# Patient Record
Sex: Female | Born: 1960 | Race: White | Hispanic: No | Marital: Married | Smoking: Never smoker
Health system: Southern US, Community
[De-identification: ages and names within clinical notes are randomized; demographics above are authoritative.]

## PROBLEM LIST (undated history)

## (undated) DIAGNOSIS — E119 Type 2 diabetes mellitus without complications: Secondary | ICD-10-CM

## (undated) HISTORY — PX: TONSILECTOMY, ADENOIDECTOMY, BILATERAL MYRINGOTOMY AND TUBES: SHX2538

## (undated) HISTORY — DX: Type 2 diabetes mellitus without complications: E11.9

---

## 2000-05-04 ENCOUNTER — Encounter: Payer: Self-pay | Admitting: Unknown Physician Specialty

## 2000-05-04 ENCOUNTER — Encounter: Admission: RE | Admit: 2000-05-04 | Discharge: 2000-05-04 | Payer: Self-pay | Admitting: Unknown Physician Specialty

## 2001-05-19 ENCOUNTER — Other Ambulatory Visit: Admission: RE | Admit: 2001-05-19 | Discharge: 2001-05-19 | Payer: Self-pay | Admitting: Obstetrics and Gynecology

## 2002-08-09 ENCOUNTER — Other Ambulatory Visit: Admission: RE | Admit: 2002-08-09 | Discharge: 2002-08-09 | Payer: Self-pay | Admitting: Obstetrics and Gynecology

## 2004-01-16 ENCOUNTER — Other Ambulatory Visit: Admission: RE | Admit: 2004-01-16 | Discharge: 2004-01-16 | Payer: Self-pay | Admitting: Obstetrics and Gynecology

## 2004-04-30 ENCOUNTER — Ambulatory Visit: Payer: Self-pay | Admitting: Cardiology

## 2004-05-14 ENCOUNTER — Encounter: Admission: RE | Admit: 2004-05-14 | Discharge: 2004-06-12 | Payer: Self-pay | Admitting: Cardiology

## 2014-05-01 ENCOUNTER — Other Ambulatory Visit: Payer: Self-pay | Admitting: Endocrinology

## 2014-05-01 DIAGNOSIS — E041 Nontoxic single thyroid nodule: Secondary | ICD-10-CM

## 2014-05-03 ENCOUNTER — Ambulatory Visit
Admission: RE | Admit: 2014-05-03 | Discharge: 2014-05-03 | Disposition: A | Discharge status: Home / Self Care | Payer: BC Managed Care – PPO | Source: Ambulatory Visit | Attending: Endocrinology | Admitting: Endocrinology

## 2014-05-03 DIAGNOSIS — E041 Nontoxic single thyroid nodule: Secondary | ICD-10-CM

## 2014-05-04 ENCOUNTER — Other Ambulatory Visit: Payer: Self-pay | Admitting: Endocrinology

## 2014-05-04 DIAGNOSIS — E042 Nontoxic multinodular goiter: Secondary | ICD-10-CM

## 2014-05-07 ENCOUNTER — Other Ambulatory Visit: Payer: Self-pay | Admitting: Endocrinology

## 2014-05-07 DIAGNOSIS — E042 Nontoxic multinodular goiter: Secondary | ICD-10-CM

## 2014-05-08 ENCOUNTER — Other Ambulatory Visit (HOSPITAL_COMMUNITY)
Admission: RE | Admit: 2014-05-08 | Discharge: 2014-05-08 | Disposition: A | Discharge status: Home / Self Care | Payer: BC Managed Care – PPO | Source: Ambulatory Visit | Attending: Interventional Radiology | Admitting: Interventional Radiology

## 2014-05-08 ENCOUNTER — Ambulatory Visit
Admission: RE | Admit: 2014-05-08 | Discharge: 2014-05-08 | Disposition: A | Discharge status: Home / Self Care | Payer: BC Managed Care – PPO | Source: Ambulatory Visit | Attending: Endocrinology | Admitting: Endocrinology

## 2014-05-08 DIAGNOSIS — E042 Nontoxic multinodular goiter: Secondary | ICD-10-CM

## 2014-05-08 DIAGNOSIS — E041 Nontoxic single thyroid nodule: Secondary | ICD-10-CM | POA: Diagnosis present

## 2014-05-10 ENCOUNTER — Other Ambulatory Visit: Payer: BC Managed Care – PPO

## 2014-05-22 ENCOUNTER — Ambulatory Visit: Payer: BC Managed Care – PPO

## 2014-05-31 ENCOUNTER — Ambulatory Visit (INDEPENDENT_AMBULATORY_CARE_PROVIDER_SITE_OTHER): Payer: BC Managed Care – PPO | Admitting: Podiatry

## 2014-05-31 ENCOUNTER — Encounter: Payer: Self-pay | Admitting: Podiatry

## 2014-05-31 ENCOUNTER — Ambulatory Visit (INDEPENDENT_AMBULATORY_CARE_PROVIDER_SITE_OTHER): Payer: BC Managed Care – PPO

## 2014-05-31 VITALS — BP 143/93 | HR 67 | Resp 16 | Ht 66.0 in | Wt 200.0 lb

## 2014-05-31 DIAGNOSIS — M2041 Other hammer toe(s) (acquired), right foot: Secondary | ICD-10-CM

## 2014-05-31 DIAGNOSIS — E119 Type 2 diabetes mellitus without complications: Secondary | ICD-10-CM

## 2014-05-31 DIAGNOSIS — D649 Anemia, unspecified: Secondary | ICD-10-CM | POA: Insufficient documentation

## 2014-05-31 DIAGNOSIS — E7800 Pure hypercholesterolemia, unspecified: Secondary | ICD-10-CM | POA: Insufficient documentation

## 2014-05-31 NOTE — Progress Notes (Signed)
   Subjective:    Patient ID: Laura Macdonald, female    DOB: 06/14/1961, 53 y.o.   MRN: [id number]  HPI Comments: Toes are separating and becoming scrunched up , the last 6-9 months the big toe has become numb. Saw another doctor and suggested surgery , i wanted to get another opinion      Review of Systems  All other systems reviewed and are negative.      Objective:   Physical Exam: I have reviewed her past mental history medications allergies surgery social history and review of systems. Pulses are strongly palpable bilateral. Neurologic sensorium is intact per Semmes-Weinstein monofilament. Deep tendon reflexes are intact bilateral and muscle strength +5 over 5 dorsiflexion plantar flexors and inverters and evertors onto the musculature is intact. Orthopedic evaluation demonstrates hallux abductovalgus deformities bilateral with hammertoe deformities 2 through 5 bilateral as well as lateral deviation and dislocation of the second and third metatarsophalangeal joints of the right foot. These are painful on palpation and range of motion. Radiographic evaluation does demonstrate severe metatarsus adductus with hammertoe deformities and dislocation of the second and third metatarsophalangeal joints of the right foot. Cutaneous evaluation demonstrates supple well-hydrated cutis no erythema edema saline drainage or odor no lesions noted.        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  Plan: Discussed etiology pathology conservative versus surgical therapies at this point I did suggest the surgical procedure would be necessary however the success rate for lateral deviation being translocated medially is not as good as the [place]. She will continue to follow-up with her doctor in [place].

## 2015-02-21 ENCOUNTER — Other Ambulatory Visit: Payer: Self-pay | Admitting: Obstetrics and Gynecology

## 2015-02-22 LAB — CYTOLOGY - PAP

## 2015-02-28 ENCOUNTER — Other Ambulatory Visit: Payer: Self-pay | Admitting: Obstetrics and Gynecology

## 2015-02-28 DIAGNOSIS — R928 Other abnormal and inconclusive findings on diagnostic imaging of breast: Secondary | ICD-10-CM

## 2015-03-07 ENCOUNTER — Other Ambulatory Visit: Payer: Self-pay

## 2015-03-20 ENCOUNTER — Ambulatory Visit
Admission: RE | Admit: 2015-03-20 | Discharge: 2015-03-20 | Disposition: A | Discharge status: Home / Self Care | Payer: BLUE CROSS/BLUE SHIELD | Source: Ambulatory Visit | Attending: Obstetrics and Gynecology | Admitting: Obstetrics and Gynecology

## 2015-03-20 DIAGNOSIS — R928 Other abnormal and inconclusive findings on diagnostic imaging of breast: Secondary | ICD-10-CM

## 2015-05-07 ENCOUNTER — Other Ambulatory Visit: Payer: Self-pay | Admitting: Endocrinology

## 2015-05-07 DIAGNOSIS — E041 Nontoxic single thyroid nodule: Secondary | ICD-10-CM

## 2015-06-04 ENCOUNTER — Other Ambulatory Visit: Payer: BLUE CROSS/BLUE SHIELD

## 2015-06-05 ENCOUNTER — Ambulatory Visit
Admission: RE | Admit: 2015-06-05 | Discharge: 2015-06-05 | Disposition: A | Discharge status: Home / Self Care | Payer: BLUE CROSS/BLUE SHIELD | Source: Ambulatory Visit | Attending: Endocrinology | Admitting: Endocrinology

## 2015-06-05 DIAGNOSIS — E041 Nontoxic single thyroid nodule: Secondary | ICD-10-CM

## 2016-05-08 IMAGING — US US THYROID BIOPSY
1 series · 13 of 16 positions shown · non-contrast
Comparison: none

CLINICAL DATA: 53-year-old female referred for biopsy of 2 separate
thyroid lesions.

[Series 1: us thyroid biopsy · 0.08mm/px · 16 acquisitions, 13 frames shown]
[im 1/16]
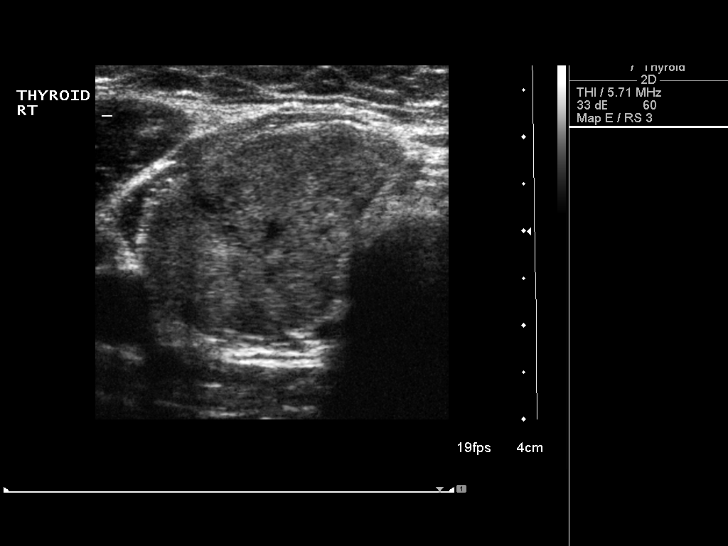
[im 2/16]
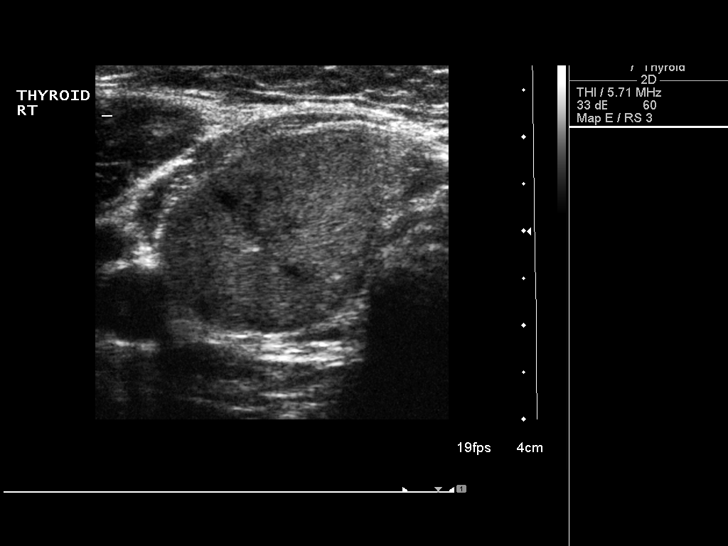
[im 4/16]
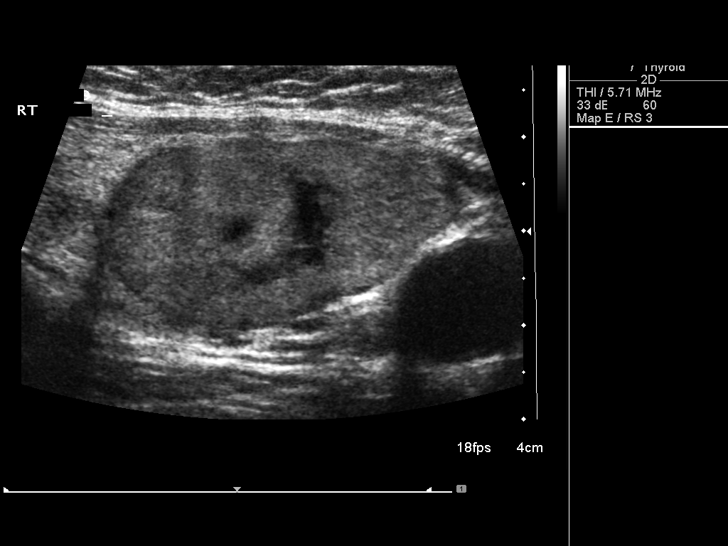
[im 5/16]
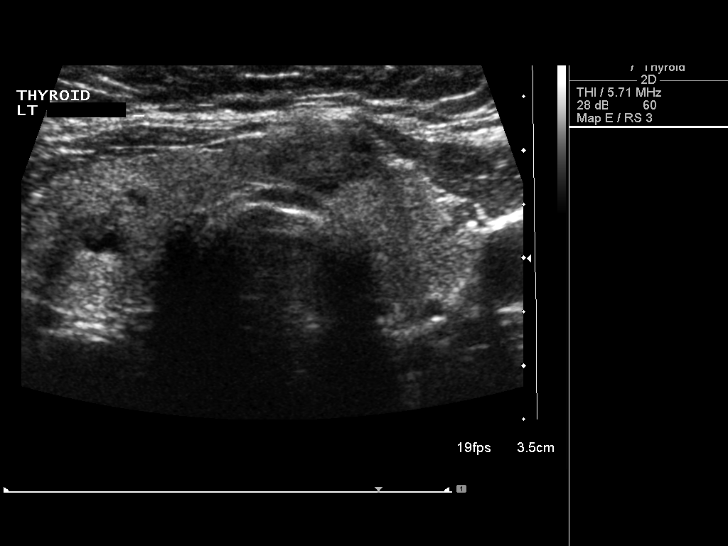
[im 6/16]
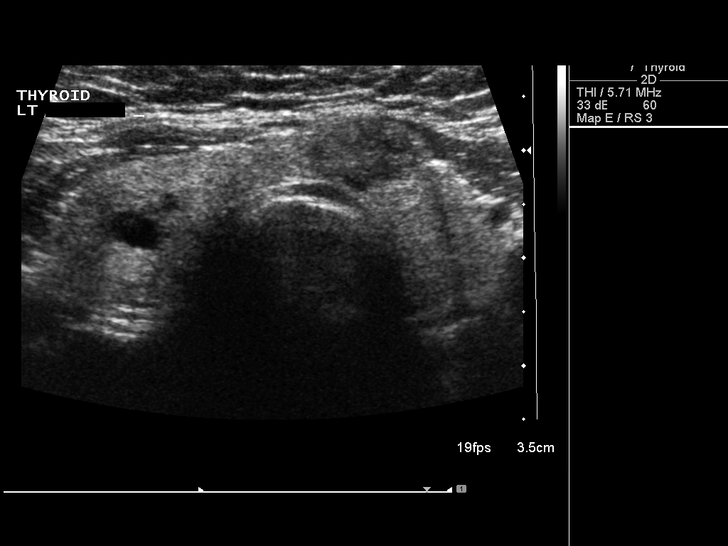
[im 7/16]
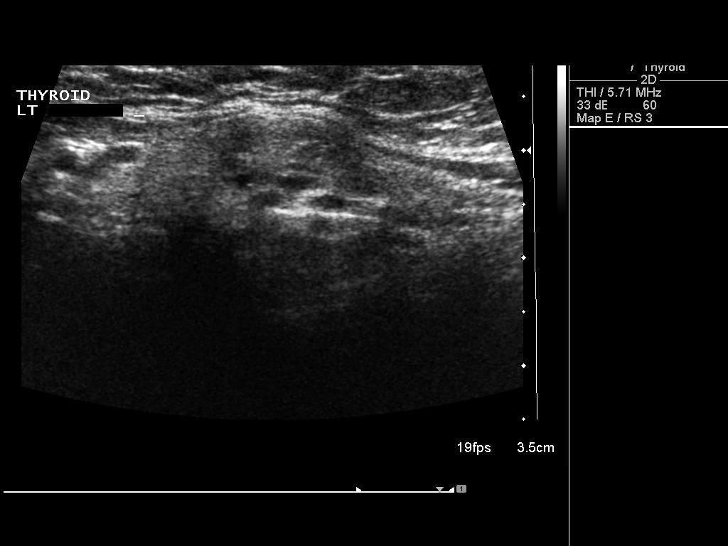
[im 9/16]
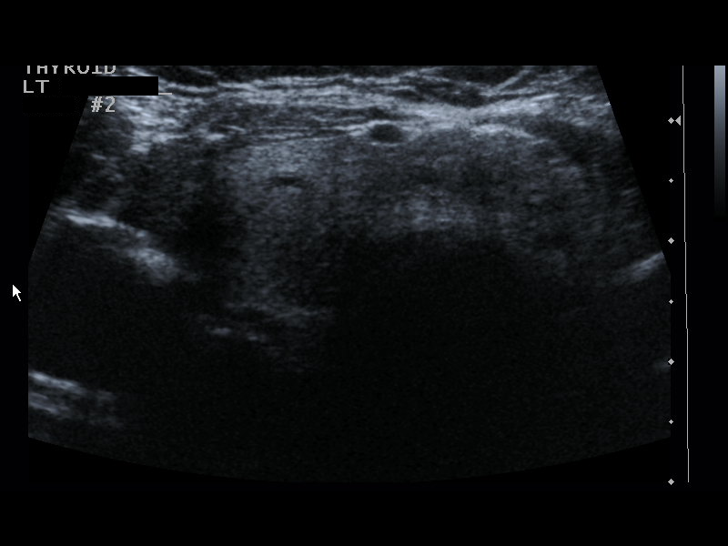
[im 10/16]
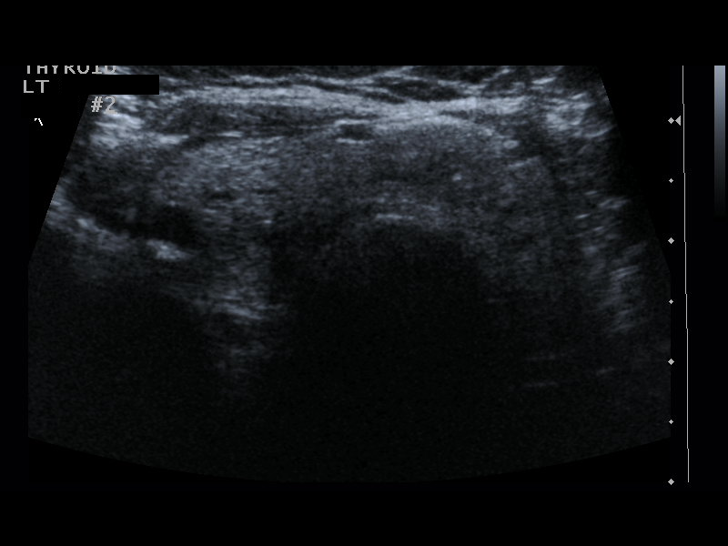
[im 11/16]
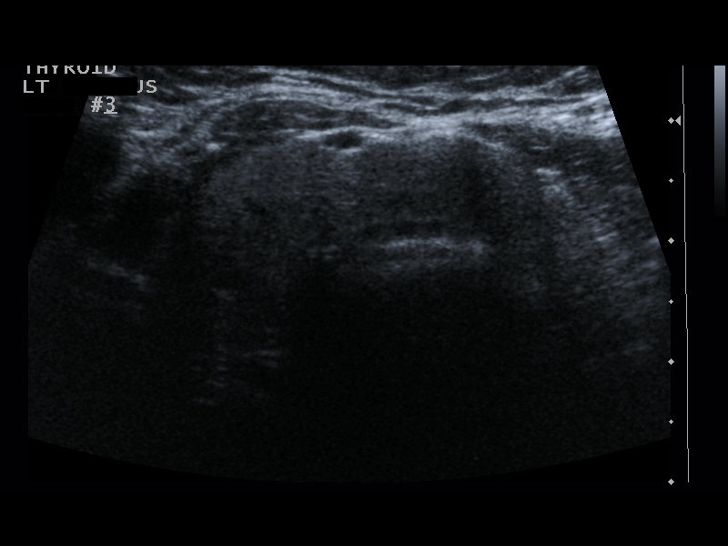
[im 12/16]
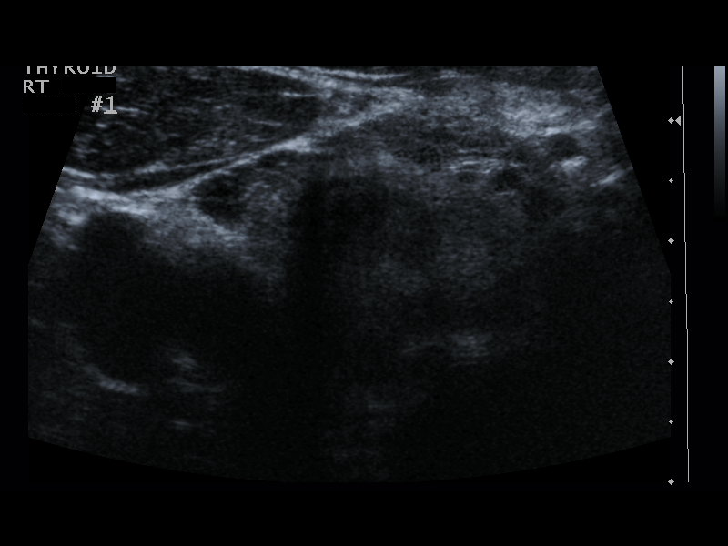
[im 13/16]
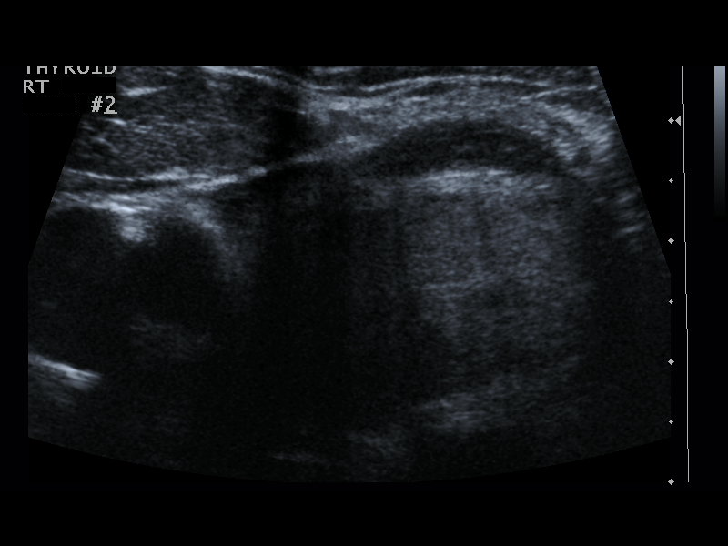
[im 15/16]
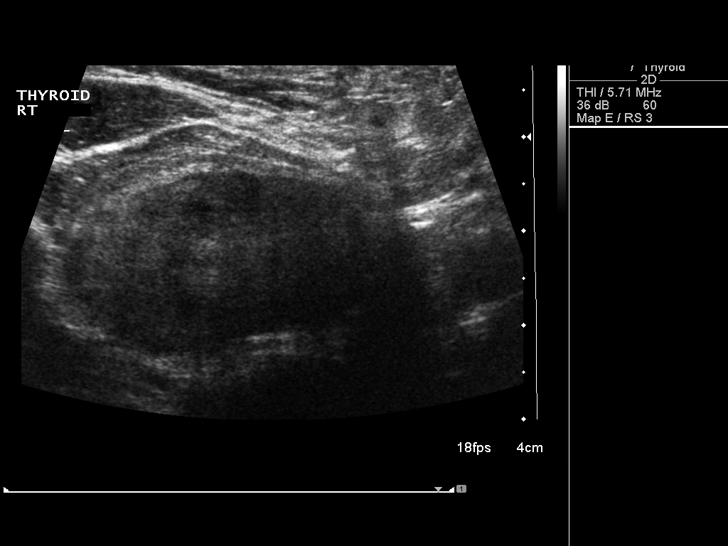
[im 16/16]
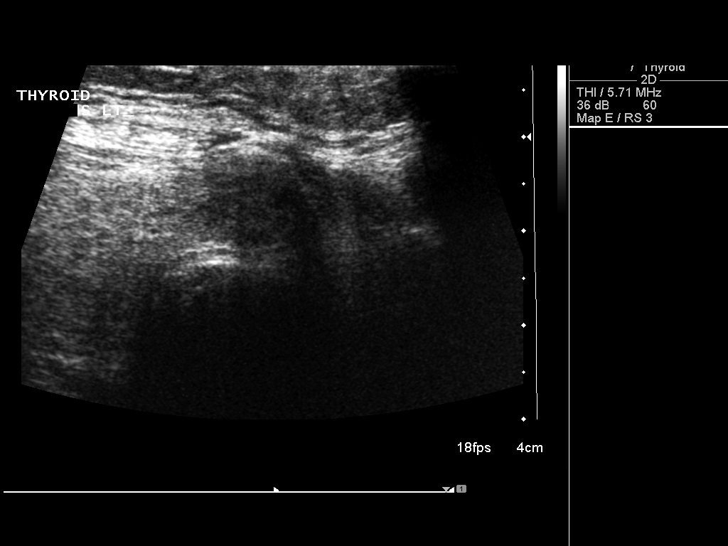

[13 of 16 positions shown; findings below may reference images not displayed]

EXAM:
ULTRASOUND GUIDED FINE NEEDLE ASPIRATION/BIOPSY BIOPSY OF THYROID
NODULES

MEDICATIONS:
NO MEDICATIONS

Total Moderate Sedation Time: NONE

PROCEDURE:
The procedure, risks, benefits, and alternatives were explained to
the patient. Questions regarding the procedure were encouraged and
answered. The patient understands and consents to the procedure.

Ultrasound survey was performed with images stored and sent to PACs.

The neck was prepped with Betadine in a sterile fashion, and a
sterile drape was applied covering the operative field. A sterile
gown and sterile gloves were used for the procedure. Local
anesthesia was provided with 1% Lidocaine.

Ultrasound guidance was used to infiltrate the region with 1%
lidocaine for local anesthesia. Three separate 25 gauge fine needle
biopsy were then acquired of the isthmic nodule using ultrasound
guidance. Images were stored.

Subsequently, ultrasound guidance was used to infiltrate the skin
overlying it a right thyroid nodule. Three separate 25 gauge fine
needle biopsy were acquired of this right nodule using ultrasound
guidance. Images were stored.

Slide preparation was performed.

Final image was stored after biopsy.

Patient tolerated the procedure well and remained hemodynamically
stable throughout.

No complications were encountered and no significant blood loss was
encounter

COMPLICATIONS:
None.
FINDINGS: Ultrasound images demonstrate isthmic nodule and right thyroid
nodule. Three separate 25 gauge fine needle biopsy were achieved of
both the isthmic nodule and a right thyroid nodule.

The post images demonstrate no fluid at the biopsy sites.

The patient had a vasovagal reaction after the procedure. She was
monitored for 1 hr with normalization of heart rate and blood
pressure. Upon discharge systolic blood pressure measured 120 mm Hg.
Heart rate was 60-70. She will was responsive and feeling normal
upon her discharge with her husband.
IMPRESSION: Status post biopsy of isthmic nodule and right thyroid nodule.
Tissue specimen sent to pathology for complete histopathologic
analysis.

## 2017-03-20 IMAGING — MG MM DIAG BREAST TOMO UNI LEFT
8 series · 8 of 24 positions shown · non-contrast
Comparison: Previous exam(s).

CLINICAL DATA: 54-year-old female presenting as screening recall
for a possible mass in the subareolar left breast.

EXAM:
DIGITAL DIAGNOSTIC LEFT MAMMOGRAM WITH 3D TOMOSYNTHESIS AND CAD
LEFT BREAST ULTRASOUND

[L CC (1 of 2)]
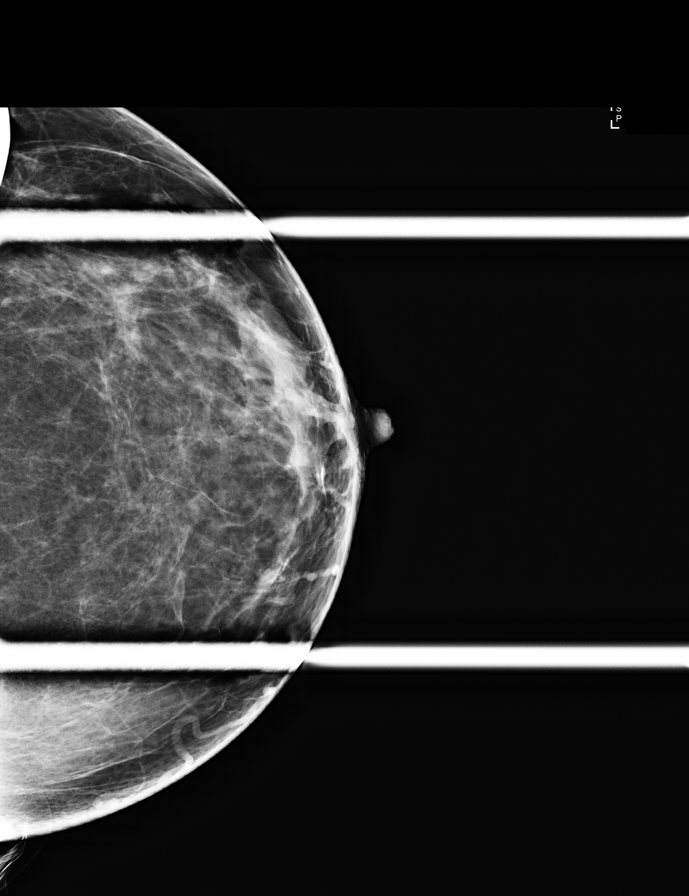

[L MLO (1 of 2)]
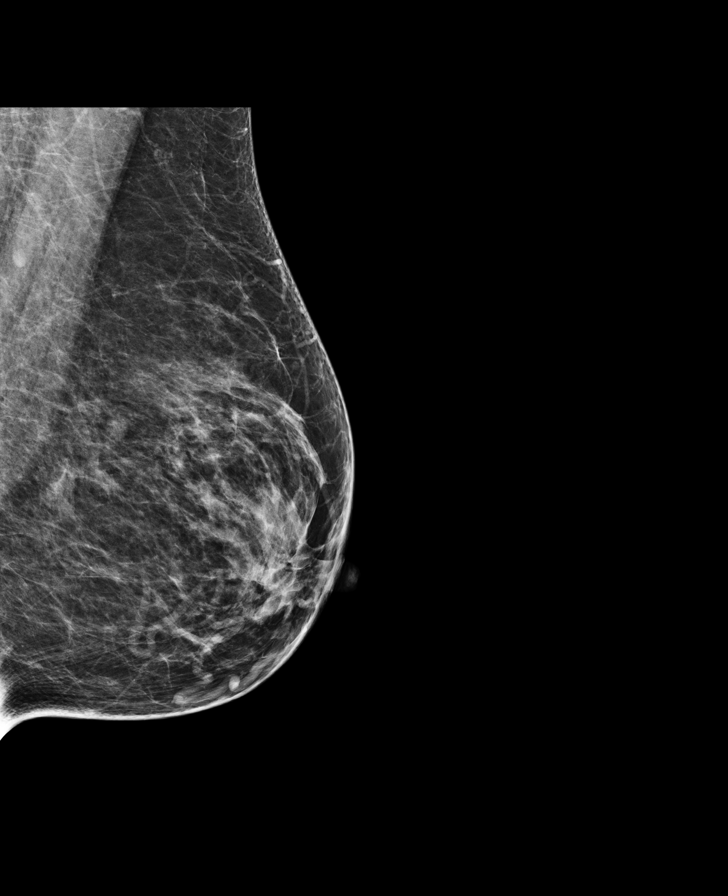

[L MLO (2 of 2)]
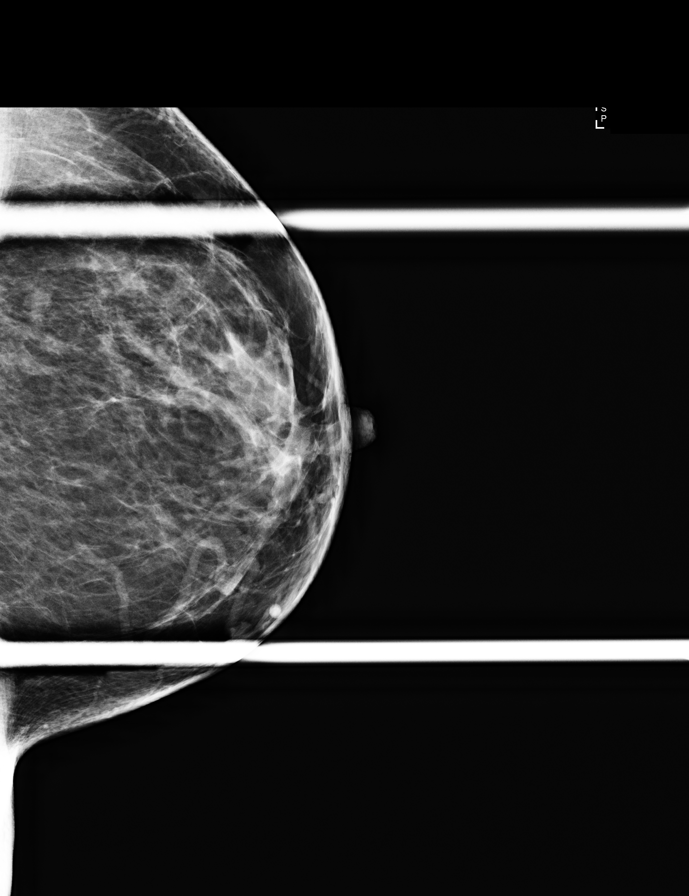

[L CC (2 of 2)]
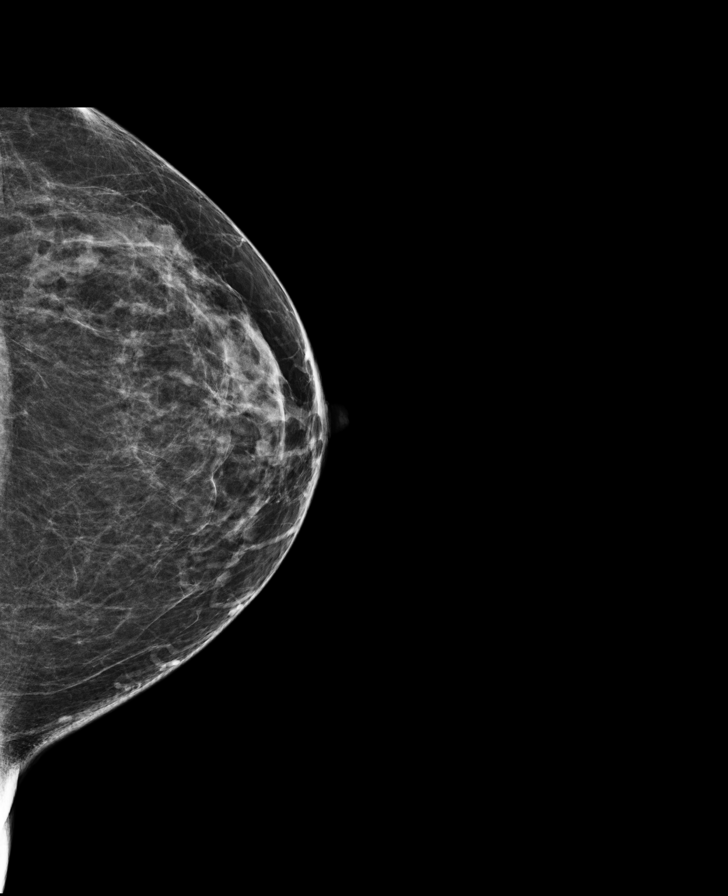

[L CC tomo (1 of 2) · tomo slice 32/63.0]
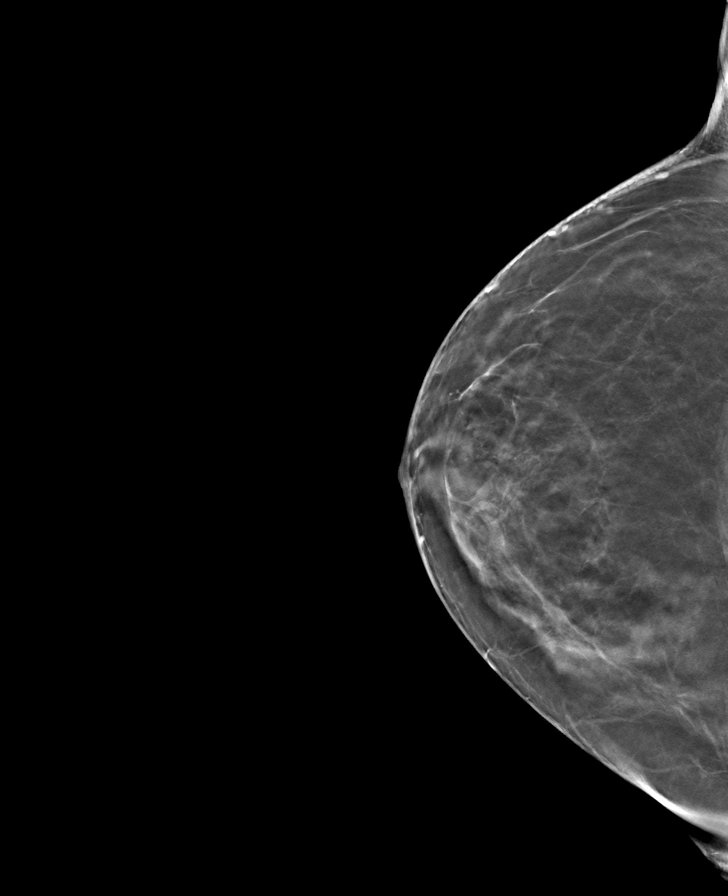

[L MLO tomo (1 of 2) · tomo slice 30/59.0]
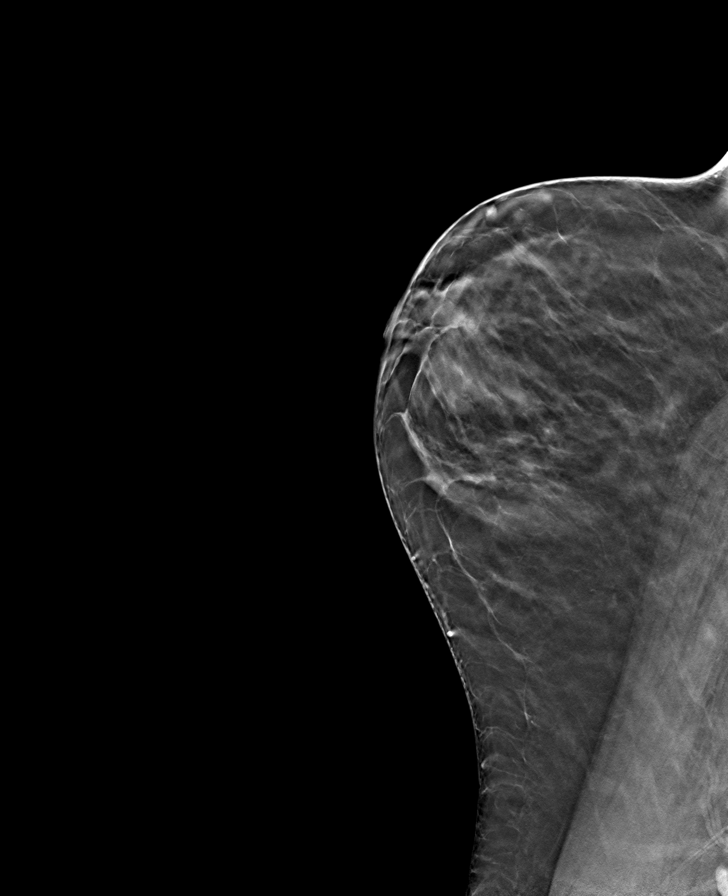

[L MLO tomo (2 of 2) · tomo slice 31/61.0]
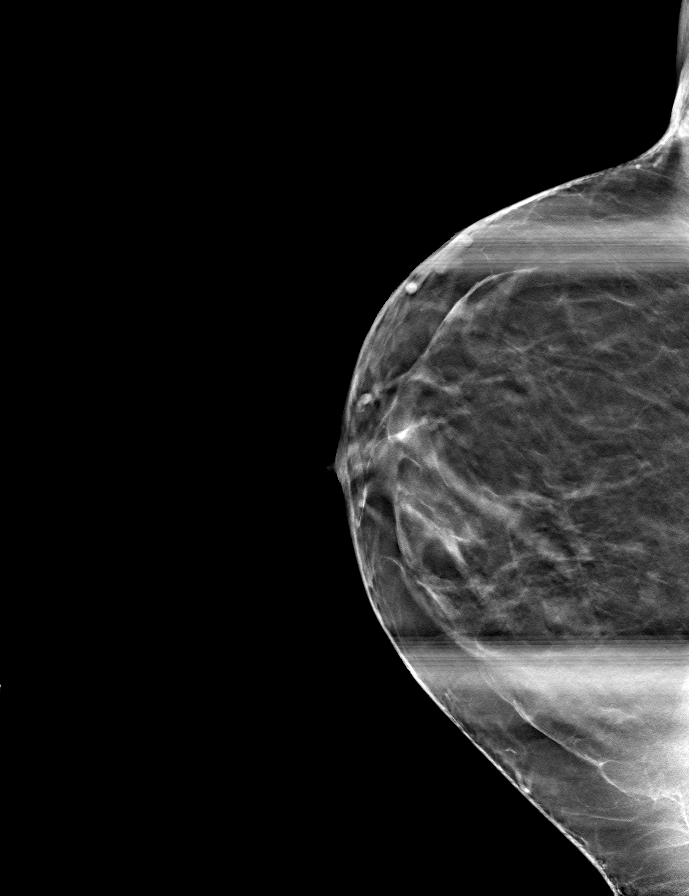

[L CC tomo (2 of 2) · tomo slice 29/58.0]
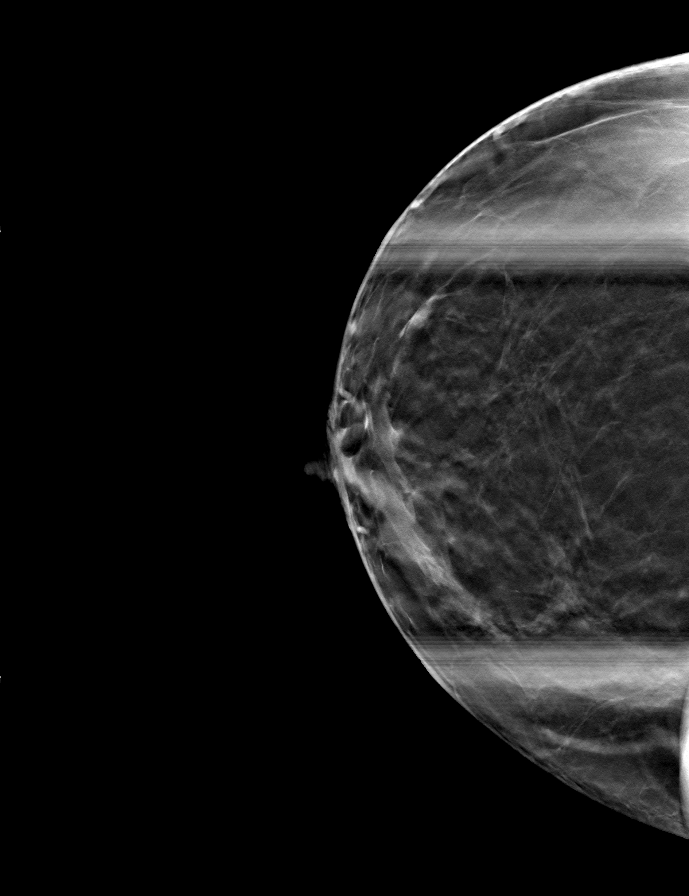

[8 of 24 positions shown; findings below may reference images not displayed]

ACR Breast Density Category b: There are scattered areas of
fibroglandular density.
FINDINGS: Additional diagnostic images of the subareolar left breast were
obtained. There is a possible 5 mm mass in the subareolar left
breast seen best on the CC tomosynthesis images. No other suspicious
masses, calcifications or areas of distortion are seen in the left
breast.

Mammographic images were processed with CAD.

Physical exam of the subareolar left breast demonstrates no
suspicious palpable masses.

Ultrasound of the subareolar left breast demonstrates a
benign-appearing anechoic circumscribed 4 mm mass at 9 o'clock,
compatible with a benign cyst or duct. No suspicious masses or areas
of shadowing are identified.
IMPRESSION: A benign appearing 4 mm cyst or duct is seen at the 9 o'clock
subareolar location in the left breast, corresponding with the area
of mammographic concern on the screening mammogram. No evidence of
malignancy in the left breast.

RECOMMENDATION:
Screening mammogram in one year.(Code:YN-2-HHT)

I have discussed the findings and recommendations with the patient.
Results were also provided in writing at the conclusion of the
visit. If applicable, a reminder letter will be sent to the patient
regarding the next appointment.

BI-RADS CATEGORY  2: Benign.

## 2017-06-05 IMAGING — US US SOFT TISSUE HEAD/NECK
1 series · 14 of 25 positions shown · non-contrast
Comparison: 05/03/2014

CLINICAL DATA: Follow-up thyroid nodules.

EXAM:
THYROID ULTRASOUND
TECHNIQUE: Ultrasound examination of the thyroid gland and adjacent soft
tissues was performed.

[Series 1: us soft tissue head/neck · 0.08mm/px · 14 of 63 slices shown]
[im 1/63]
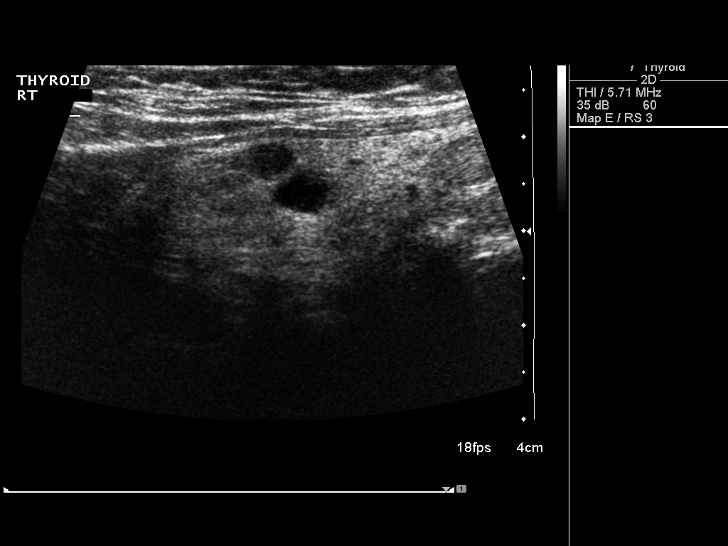
[im 6/63]
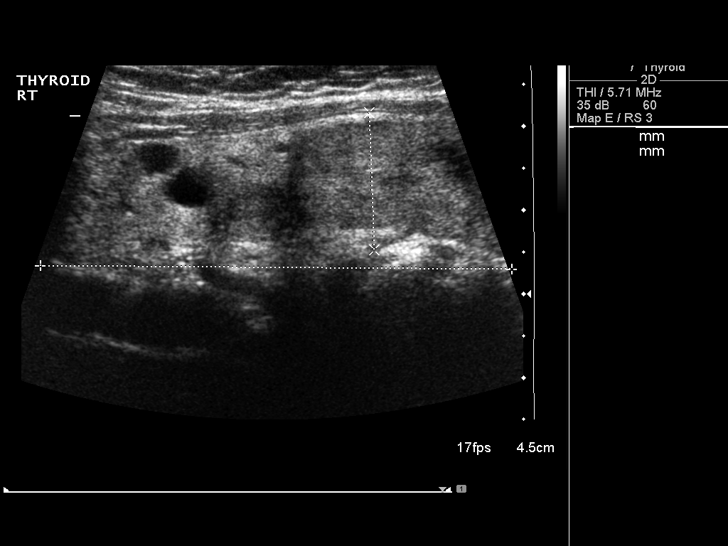
[im 11/63]
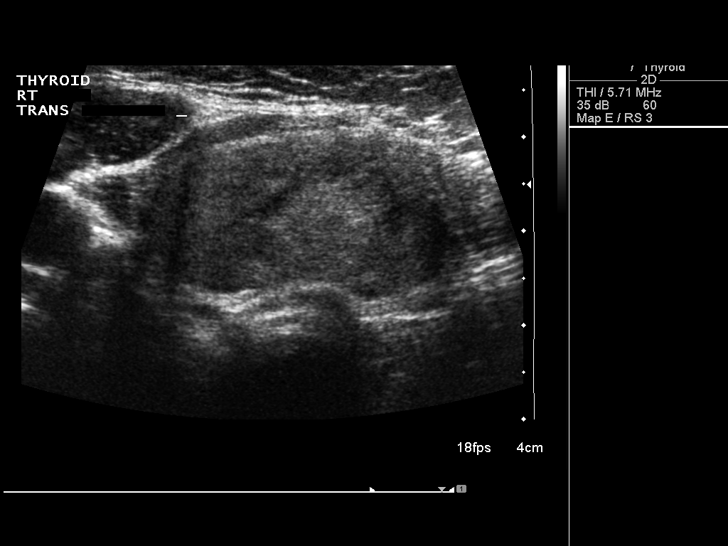
[im 16/63]
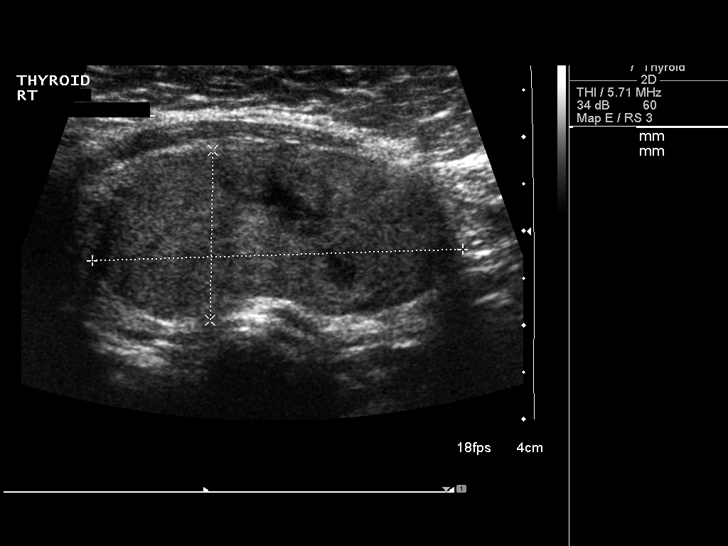
[im 21/63]
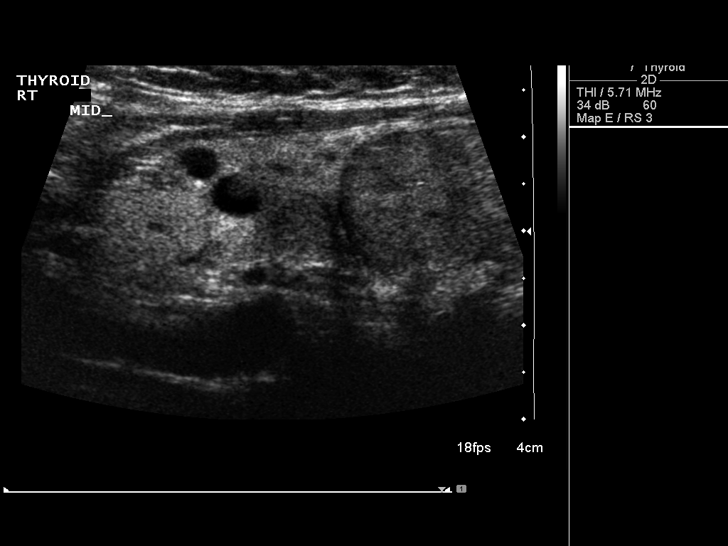
[im 24/63]
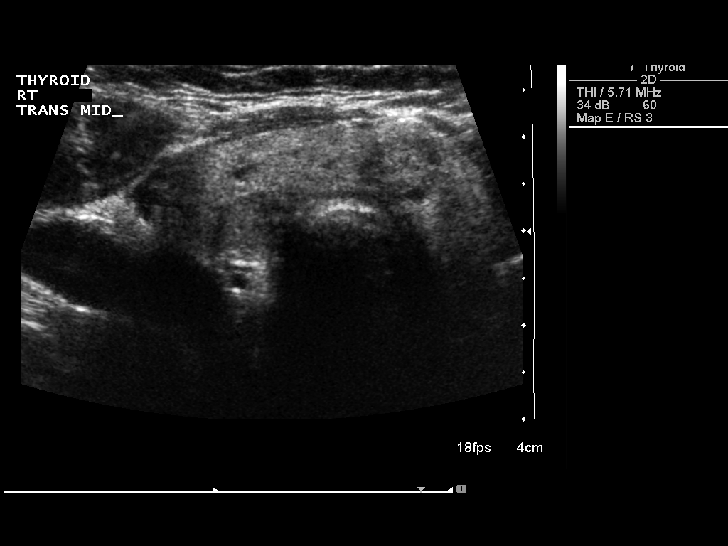
[im 29/63]
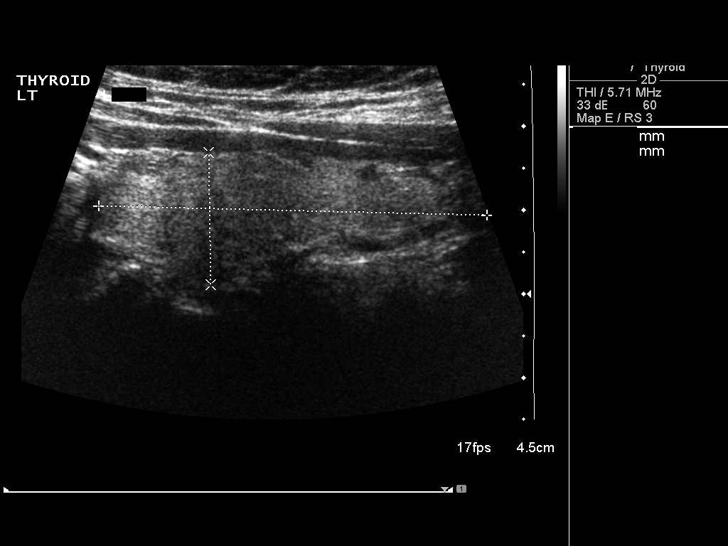
[im 34/63]
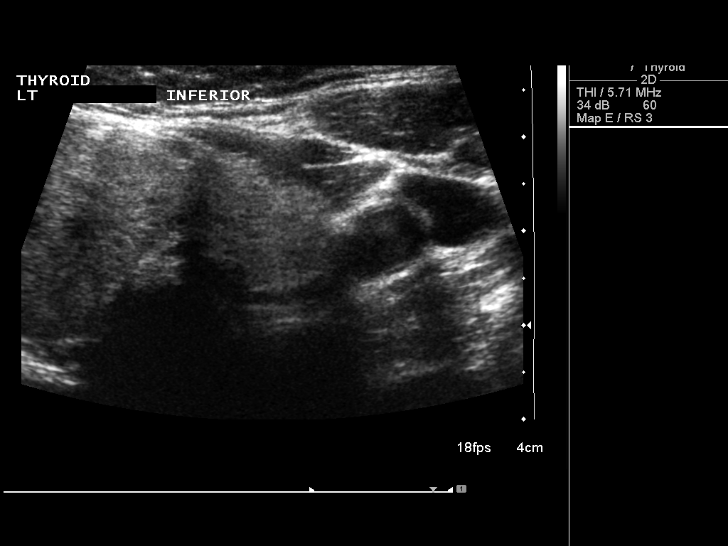
[im 39/63]
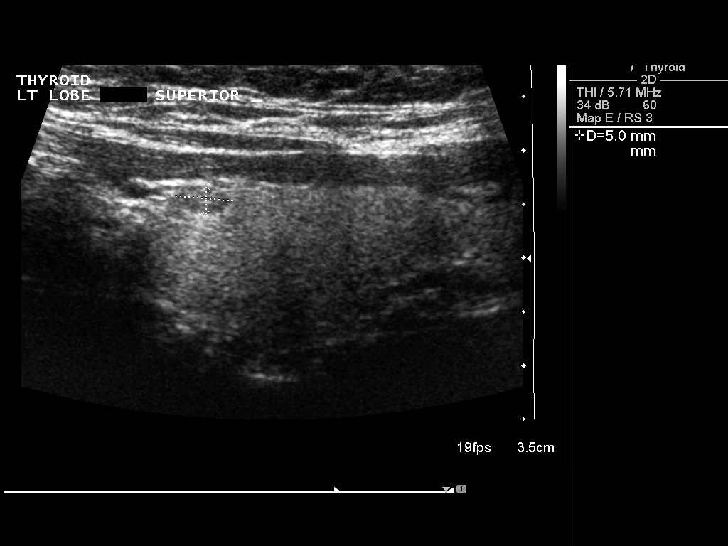
[im 42/63]
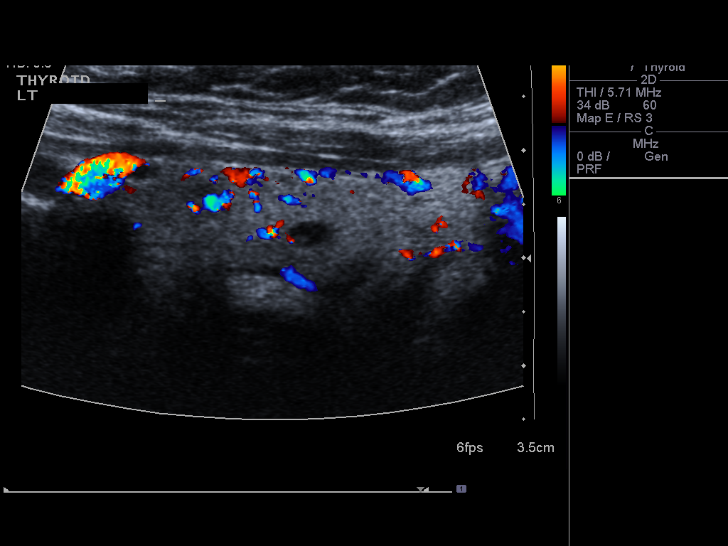
[im 47/63]
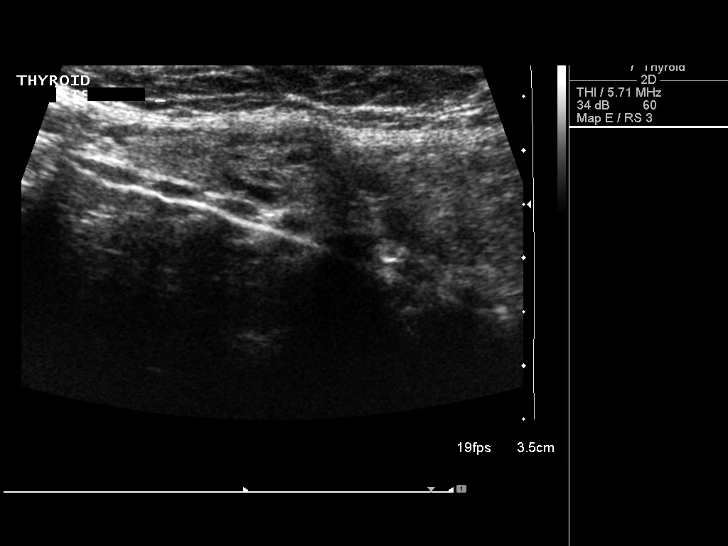
[im 52/63]
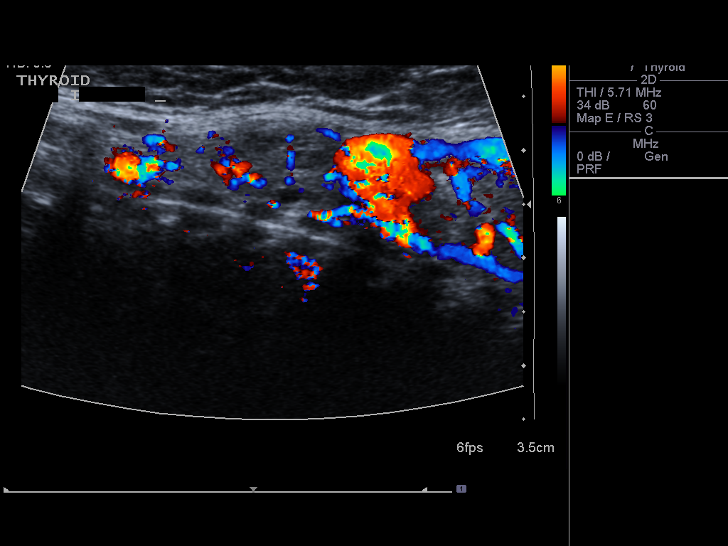
[im 57/63]
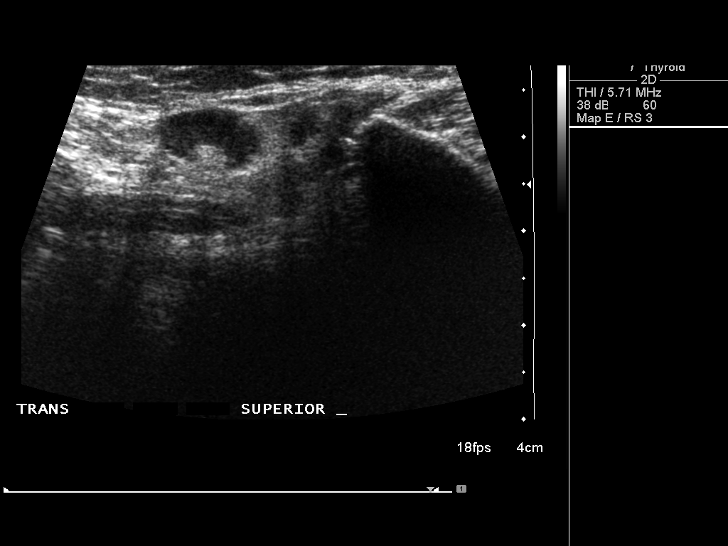
[im 63/63]
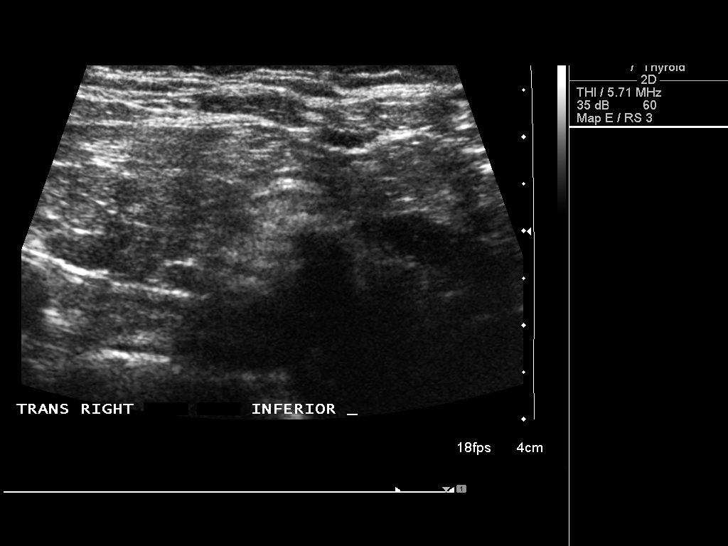

[14 of 25 positions shown; findings below may reference images not displayed]

FINDINGS: Right thyroid lobe

Measurements: 6.3 x 1.7 x 1.9 cm. Two small cystic nodules along the
superior right thyroid lobe. Largest cystic nodule measures up to
0.7 cm. There is a solid heterogeneous nodule occupying the mid and
inferior right thyroid lobe measuring 3.9 x 1.8 x 3.4 cm and
previously measured 3.9 x 1.8 x 2.8 cm. There is a stable solid
nodule in mid right thyroid lobe measuring up to 0.8 cm.

Left thyroid lobe

Measurements: 4.8 x 1.6 x 1.4 cm. Small hypoechoic nodules or cysts
in the left thyroid lobe, largest measuring 0.4 cm. There is a small
isoechoic nodule along the superior left thyroid lobe measuring up
to 0.5 cm.

Isthmus

Thickness: 0.8 cm. Heterogeneous nodule in the isthmus measuring
x 0.8 x 0.8 cm and previously measured 1.3 x 0.7 x 0.8 cm.

Lymphadenopathy

None visualized.
IMPRESSION: Multi nodular goiter.  Minimal change in the dominant nodules.

## 2020-02-20 DIAGNOSIS — Z01419 Encounter for gynecological examination (general) (routine) without abnormal findings: Secondary | ICD-10-CM | POA: Diagnosis not present

## 2020-02-20 DIAGNOSIS — Z1231 Encounter for screening mammogram for malignant neoplasm of breast: Secondary | ICD-10-CM | POA: Diagnosis not present

## 2020-02-20 DIAGNOSIS — Z6834 Body mass index (BMI) 34.0-34.9, adult: Secondary | ICD-10-CM | POA: Diagnosis not present

## 2020-02-28 DIAGNOSIS — Z13820 Encounter for screening for osteoporosis: Secondary | ICD-10-CM | POA: Diagnosis not present

## 2020-03-27 DIAGNOSIS — R7301 Impaired fasting glucose: Secondary | ICD-10-CM | POA: Diagnosis not present

## 2020-03-27 DIAGNOSIS — E7800 Pure hypercholesterolemia, unspecified: Secondary | ICD-10-CM | POA: Diagnosis not present

## 2020-03-27 DIAGNOSIS — E041 Nontoxic single thyroid nodule: Secondary | ICD-10-CM | POA: Diagnosis not present

## 2020-03-27 DIAGNOSIS — I10 Essential (primary) hypertension: Secondary | ICD-10-CM | POA: Diagnosis not present

## 2020-04-01 ENCOUNTER — Other Ambulatory Visit: Payer: Self-pay | Admitting: Endocrinology

## 2020-04-01 DIAGNOSIS — E041 Nontoxic single thyroid nodule: Secondary | ICD-10-CM

## 2020-04-29 ENCOUNTER — Ambulatory Visit
Admission: RE | Admit: 2020-04-29 | Discharge: 2020-04-29 | Disposition: A | Discharge status: Home / Self Care | Payer: BLUE CROSS/BLUE SHIELD | Source: Ambulatory Visit | Attending: Endocrinology | Admitting: Endocrinology

## 2020-04-29 DIAGNOSIS — E041 Nontoxic single thyroid nodule: Secondary | ICD-10-CM

## 2020-05-14 ENCOUNTER — Ambulatory Visit
Admission: RE | Admit: 2020-05-14 | Discharge: 2020-05-14 | Disposition: A | Discharge status: Home / Self Care | Payer: BC Managed Care – PPO | Source: Ambulatory Visit | Attending: Endocrinology | Admitting: Endocrinology

## 2020-05-14 DIAGNOSIS — E041 Nontoxic single thyroid nodule: Secondary | ICD-10-CM | POA: Diagnosis not present

## 2020-10-17 DIAGNOSIS — E7800 Pure hypercholesterolemia, unspecified: Secondary | ICD-10-CM | POA: Diagnosis not present

## 2020-10-17 DIAGNOSIS — E041 Nontoxic single thyroid nodule: Secondary | ICD-10-CM | POA: Diagnosis not present

## 2020-10-17 DIAGNOSIS — R7301 Impaired fasting glucose: Secondary | ICD-10-CM | POA: Diagnosis not present

## 2020-10-17 DIAGNOSIS — I10 Essential (primary) hypertension: Secondary | ICD-10-CM | POA: Diagnosis not present

## 2020-12-11 DIAGNOSIS — U071 COVID-19: Secondary | ICD-10-CM | POA: Diagnosis not present

## 2020-12-12 ENCOUNTER — Other Ambulatory Visit: Payer: Self-pay | Admitting: Endocrinology

## 2020-12-12 DIAGNOSIS — E041 Nontoxic single thyroid nodule: Secondary | ICD-10-CM

## 2021-01-20 ENCOUNTER — Ambulatory Visit
Admission: RE | Admit: 2021-01-20 | Discharge: 2021-01-20 | Disposition: A | Discharge status: Home / Self Care | Payer: BC Managed Care – PPO | Source: Ambulatory Visit | Attending: Endocrinology | Admitting: Endocrinology

## 2021-01-20 DIAGNOSIS — E041 Nontoxic single thyroid nodule: Secondary | ICD-10-CM

## 2021-03-05 DIAGNOSIS — R7301 Impaired fasting glucose: Secondary | ICD-10-CM | POA: Diagnosis not present

## 2021-03-05 DIAGNOSIS — E041 Nontoxic single thyroid nodule: Secondary | ICD-10-CM | POA: Diagnosis not present

## 2021-03-05 DIAGNOSIS — E7800 Pure hypercholesterolemia, unspecified: Secondary | ICD-10-CM | POA: Diagnosis not present

## 2021-03-12 DIAGNOSIS — R7301 Impaired fasting glucose: Secondary | ICD-10-CM | POA: Diagnosis not present

## 2021-03-12 DIAGNOSIS — E7800 Pure hypercholesterolemia, unspecified: Secondary | ICD-10-CM | POA: Diagnosis not present

## 2021-03-12 DIAGNOSIS — I10 Essential (primary) hypertension: Secondary | ICD-10-CM | POA: Diagnosis not present

## 2021-03-12 DIAGNOSIS — E041 Nontoxic single thyroid nodule: Secondary | ICD-10-CM | POA: Diagnosis not present

## 2021-03-12 DIAGNOSIS — Z23 Encounter for immunization: Secondary | ICD-10-CM | POA: Diagnosis not present

## 2021-05-05 DIAGNOSIS — Z6834 Body mass index (BMI) 34.0-34.9, adult: Secondary | ICD-10-CM | POA: Diagnosis not present

## 2021-05-05 DIAGNOSIS — Z1231 Encounter for screening mammogram for malignant neoplasm of breast: Secondary | ICD-10-CM | POA: Diagnosis not present

## 2021-05-05 DIAGNOSIS — Z01419 Encounter for gynecological examination (general) (routine) without abnormal findings: Secondary | ICD-10-CM | POA: Diagnosis not present

## 2022-05-15 IMAGING — US US THYROID
1 series · 12 of 25 positions shown · non-contrast
Comparison: 06/05/2015 and 05/03/2014

CLINICAL DATA: Prior ultrasound follow-up. History of multinodular
thyroid goiter with prior fine-needle aspiration isthmus and right
thyroid nodules in 8288. Cytology was benign for both nodules.
Additional follow-up ultrasound was performed in 4379.

EXAM:
THYROID ULTRASOUND
TECHNIQUE: Ultrasound examination of the thyroid gland and adjacent soft
tissues was performed.

[Series 1: us thyroid · 0.06mm/px · 12 of 49 slices shown]
[im 3/49]
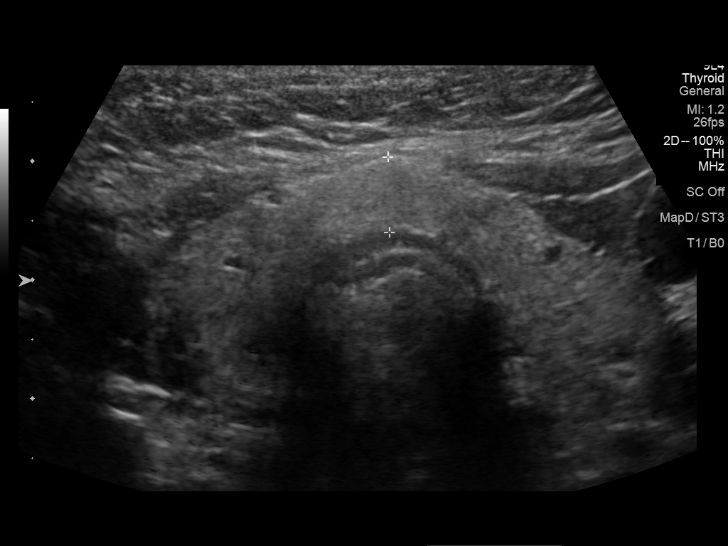
[im 7/49]
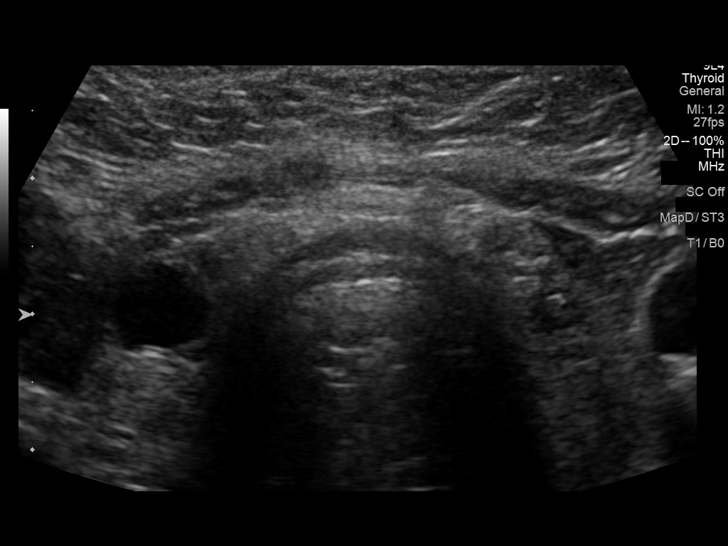
[im 11/49]
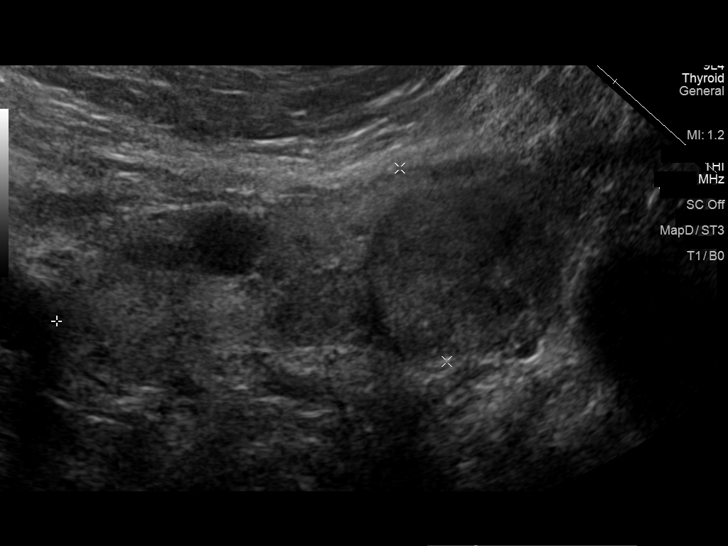
[im 15/49]
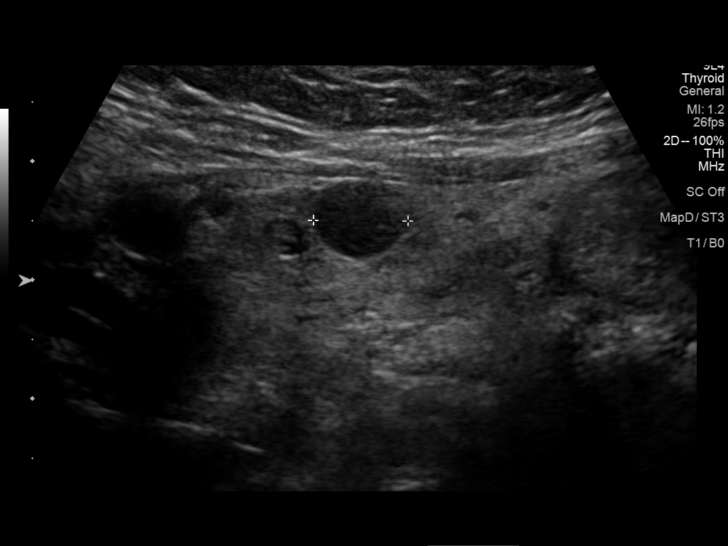
[im 19/49]
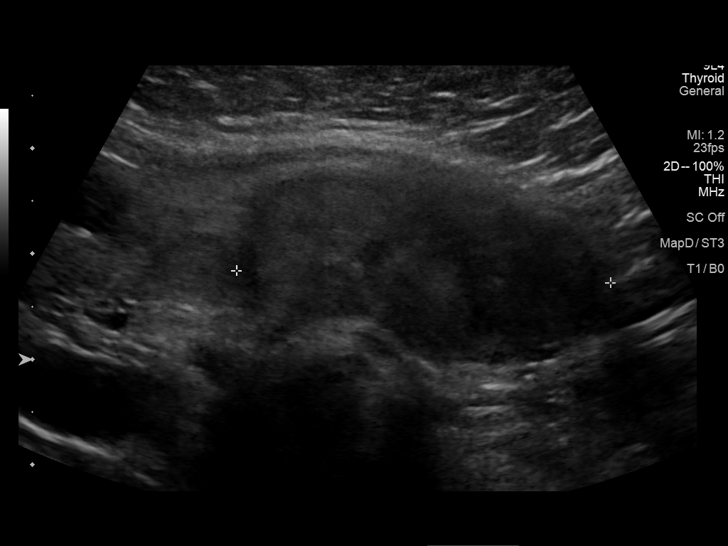
[im 23/49]
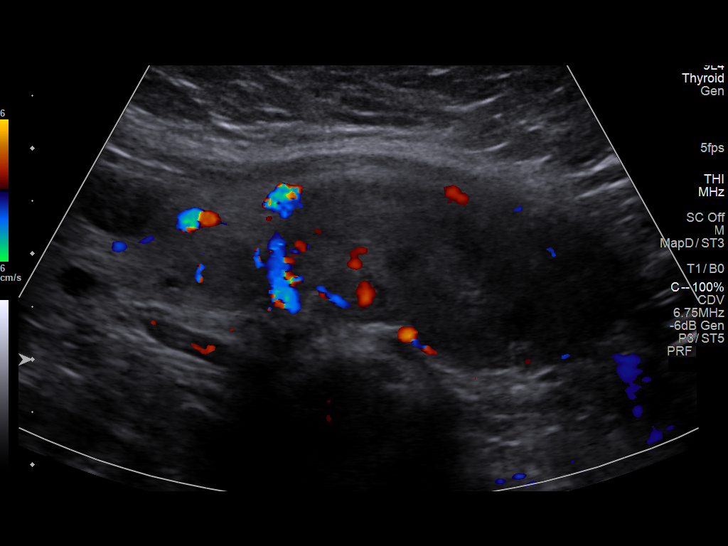
[im 27/49]
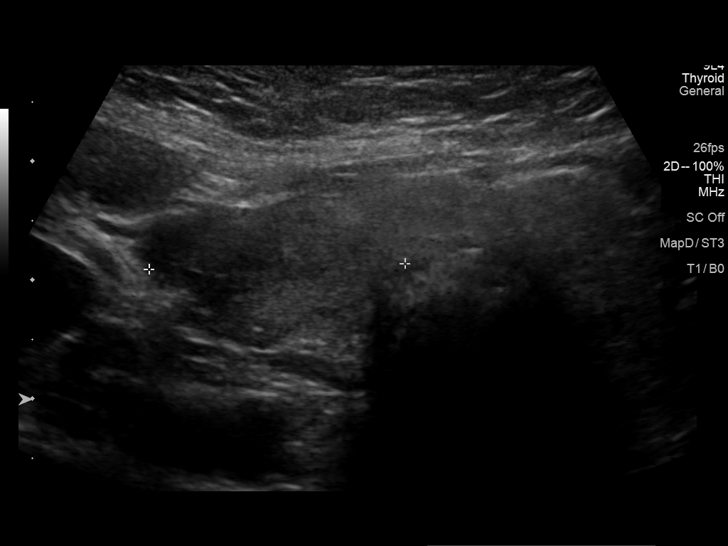
[im 31/49]
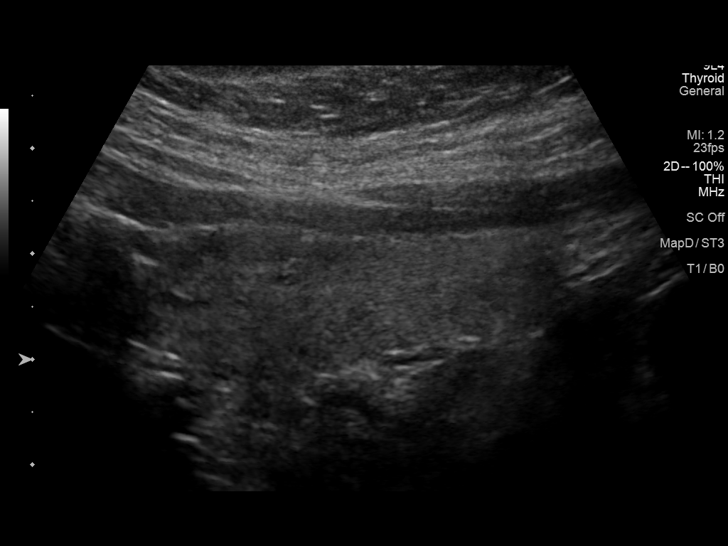
[im 35/49]
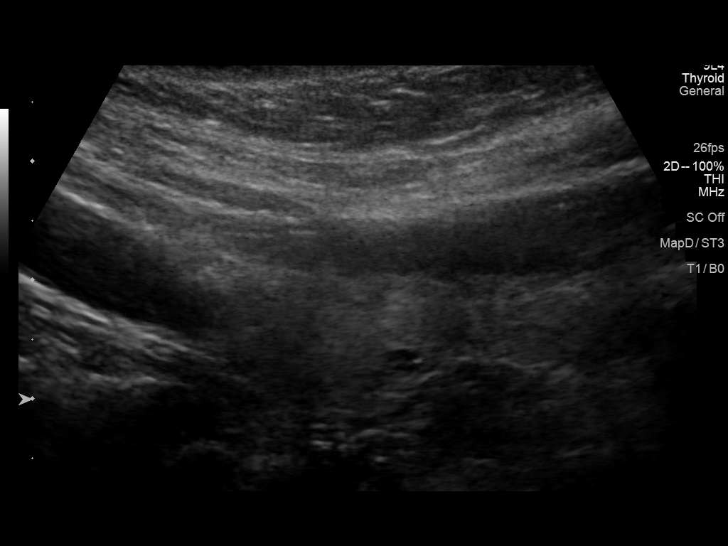
[im 39/49]
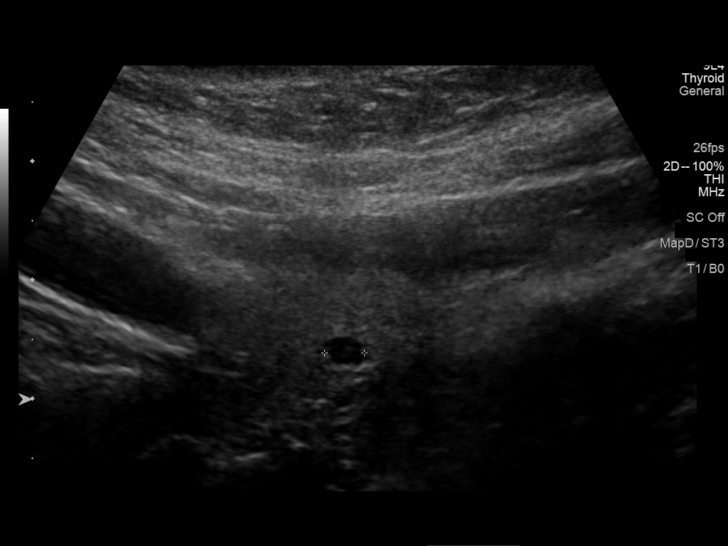
[im 43/49]
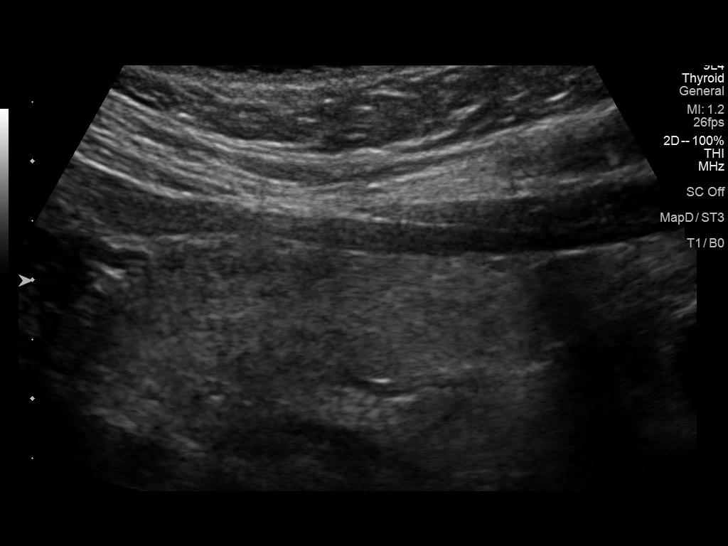
[im 47/49]
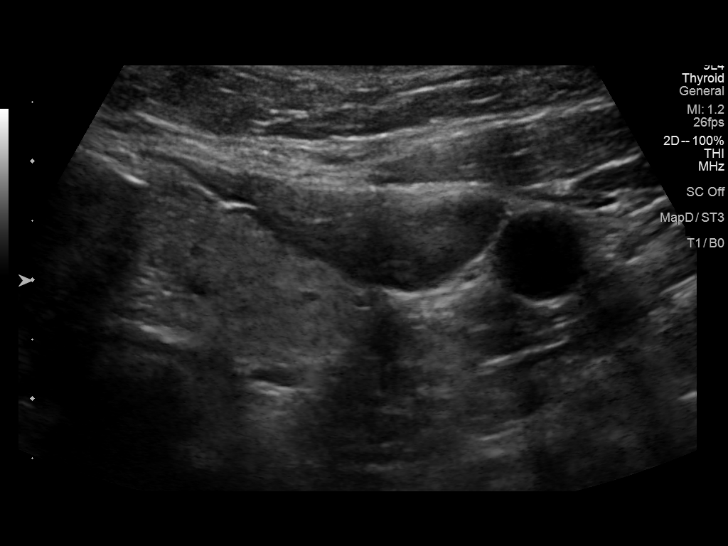

[12 of 25 positions shown; findings below may reference images not displayed]

FINDINGS: Parenchymal Echotexture: Mildly heterogenous

Isthmus: 0.6 cm

Right lobe: 5.6 x 1.8 x 2.2 cm

Left lobe: 4.2 x 1.5 x 1.4 cm

_________________________________________________________

Estimated total number of nodules >/= 1 cm: 2

Number of spongiform nodules >/=  2 cm not described below (TR1): 0

Number of mixed cystic and solid nodules >/= 1.5 cm not described
below (TR2): 0

_________________________________________________________

Nodule # 1:

Prior biopsy: Yes

Location: Isthmus

Maximum size: 1.4 cm; Other 2 dimensions: 1.0 x 1.0 cm, previously,
1.3 cm

Composition: solid/almost completely solid (2)

Echogenicity: hypoechoic (2)

Shape: not taller-than-wide (0)

Margins: smooth (0)

Echogenic foci: none (0)

ACR TI-RADS total points: 4.

ACR TI-RADS risk category:  TR4 (4-6 points).

Significant change in size (>/= 20% in two dimensions and minimal
increase of 2 mm): No

Change in features: No

Change in ACR TI-RADS risk category: No

ACR TI-RADS recommendations:

This nodule appears stable since 8288 and is therefore considered
benign given stability over a 6 year time frame.

_________________________________________________________

Nodule # 2:

Prior biopsy: No

Location: Right; Superior

Maximum size: 0.8 cm; Other 2 dimensions: 0.7 x 0.6 cm, previously,
0.7 cm

Composition: cystic/almost completely cystic (0)

Echogenicity: anechoic (0)

Shape: not taller-than-wide (0)

Margins: smooth (0)

Echogenic foci: none (0)

ACR TI-RADS total points: 0.

ACR TI-RADS risk category:  TR1 (0-1 points).

Significant change in size (>/= 20% in two dimensions and minimal
increase of 2 mm): No

Change in features: No

Change in ACR TI-RADS risk category: No

ACR TI-RADS recommendations:

This nodule does NOT meet TI-RADS criteria for biopsy or dedicated
follow-up. This cyst is stable since 4379.

_________________________________________________________

Nodule # 3:

Prior biopsy: No

Location: Right; Mid

Maximum size: 0.9 cm; Other 2 dimensions: 0.8 x 0.7 cm, previously,
0.8 cm

Composition: solid/almost completely solid (2)

Echogenicity: hypoechoic (2)

Shape: not taller-than-wide (0)

Margins: smooth (0)

Echogenic foci: none (0)

ACR TI-RADS total points: 4.

ACR TI-RADS risk category:  TR4 (4-6 points).

Significant change in size (>/= 20% in two dimensions and minimal
increase of 2 mm): No

Change in features: No

Change in ACR TI-RADS risk category: No

ACR TI-RADS recommendations:

This nodule is stable since 8288 and therefore considered benign
given stability over a 6 year time frame.

_________________________________________________________

Nodule # 4:

Prior biopsy: Yes

Location: Right; Inferior

Maximum size: 3.8 cm; Other 2 dimensions: 3.6 x 1.6 cm, previously,
3.9 cm

Composition: solid/almost completely solid (2)

Echogenicity: hypoechoic (2)

Shape: not taller-than-wide (0)

Margins: smooth (0)

Echogenic foci: none (0)

ACR TI-RADS total points: 4.

ACR TI-RADS risk category:  TR4 (4-6 points).

Significant change in size (>/= 20% in two dimensions and minimal
increase of 2 mm): No

Change in features: No

Change in ACR TI-RADS risk category: No

ACR TI-RADS recommendations:

This nodule is stable since 8288 and therefore considered benign
given stability over a 6 year time frame.

_________________________________________________________

No abnormal lymph nodes identified.
IMPRESSION: Three separate solid nodules in the thyroid gland are stable since
8288 including the previously sampled isthmus and right nodules.
These are therefore considered benign given stability over a 6 year
interval. A benign cyst in the right lobe is stable since 4379.

The above is in keeping with the ACR TI-RADS recommendations - [HOSPITAL] 1827;[DATE].

## 2022-05-27 ENCOUNTER — Other Ambulatory Visit: Payer: Self-pay

## 2022-08-12 ENCOUNTER — Telehealth: Payer: Self-pay

## 2022-12-08 DIAGNOSIS — E041 Nontoxic single thyroid nodule: Secondary | ICD-10-CM | POA: Diagnosis not present

## 2022-12-08 DIAGNOSIS — R7301 Impaired fasting glucose: Secondary | ICD-10-CM | POA: Diagnosis not present

## 2022-12-08 DIAGNOSIS — E7800 Pure hypercholesterolemia, unspecified: Secondary | ICD-10-CM | POA: Diagnosis not present

## 2022-12-15 DIAGNOSIS — R7301 Impaired fasting glucose: Secondary | ICD-10-CM | POA: Diagnosis not present

## 2022-12-15 DIAGNOSIS — E7800 Pure hypercholesterolemia, unspecified: Secondary | ICD-10-CM | POA: Diagnosis not present

## 2022-12-15 DIAGNOSIS — I10 Essential (primary) hypertension: Secondary | ICD-10-CM | POA: Diagnosis not present

## 2022-12-15 DIAGNOSIS — E041 Nontoxic single thyroid nodule: Secondary | ICD-10-CM | POA: Diagnosis not present

## 2022-12-17 DIAGNOSIS — Z833 Family history of diabetes mellitus: Secondary | ICD-10-CM | POA: Diagnosis not present

## 2022-12-17 DIAGNOSIS — Z6833 Body mass index (BMI) 33.0-33.9, adult: Secondary | ICD-10-CM | POA: Diagnosis not present

## 2022-12-17 DIAGNOSIS — Z87891 Personal history of nicotine dependence: Secondary | ICD-10-CM | POA: Diagnosis not present

## 2022-12-17 DIAGNOSIS — E669 Obesity, unspecified: Secondary | ICD-10-CM | POA: Diagnosis not present

## 2022-12-17 DIAGNOSIS — E785 Hyperlipidemia, unspecified: Secondary | ICD-10-CM | POA: Diagnosis not present

## 2022-12-17 DIAGNOSIS — I10 Essential (primary) hypertension: Secondary | ICD-10-CM | POA: Diagnosis not present

## 2022-12-23 DIAGNOSIS — M1711 Unilateral primary osteoarthritis, right knee: Secondary | ICD-10-CM | POA: Diagnosis not present

## 2022-12-23 DIAGNOSIS — M25561 Pain in right knee: Secondary | ICD-10-CM | POA: Diagnosis not present

## 2023-03-26 NOTE — Telephone Encounter (Signed)
Refer to 2/14 note

## 2023-06-08 DIAGNOSIS — Z124 Encounter for screening for malignant neoplasm of cervix: Secondary | ICD-10-CM | POA: Diagnosis not present

## 2023-06-08 DIAGNOSIS — R7301 Impaired fasting glucose: Secondary | ICD-10-CM | POA: Diagnosis not present

## 2023-06-08 DIAGNOSIS — Z6833 Body mass index (BMI) 33.0-33.9, adult: Secondary | ICD-10-CM | POA: Diagnosis not present

## 2023-06-08 DIAGNOSIS — E041 Nontoxic single thyroid nodule: Secondary | ICD-10-CM | POA: Diagnosis not present

## 2023-06-08 DIAGNOSIS — Z01419 Encounter for gynecological examination (general) (routine) without abnormal findings: Secondary | ICD-10-CM | POA: Diagnosis not present

## 2023-06-08 DIAGNOSIS — Z13820 Encounter for screening for osteoporosis: Secondary | ICD-10-CM | POA: Diagnosis not present

## 2023-06-08 DIAGNOSIS — E7800 Pure hypercholesterolemia, unspecified: Secondary | ICD-10-CM | POA: Diagnosis not present

## 2023-06-08 DIAGNOSIS — Z1231 Encounter for screening mammogram for malignant neoplasm of breast: Secondary | ICD-10-CM | POA: Diagnosis not present

## 2023-06-14 ENCOUNTER — Other Ambulatory Visit: Payer: Self-pay | Admitting: Obstetrics and Gynecology

## 2023-06-14 DIAGNOSIS — R928 Other abnormal and inconclusive findings on diagnostic imaging of breast: Secondary | ICD-10-CM

## 2023-06-17 DIAGNOSIS — E7800 Pure hypercholesterolemia, unspecified: Secondary | ICD-10-CM | POA: Diagnosis not present

## 2023-06-17 DIAGNOSIS — E1165 Type 2 diabetes mellitus with hyperglycemia: Secondary | ICD-10-CM | POA: Diagnosis not present

## 2023-06-17 DIAGNOSIS — I10 Essential (primary) hypertension: Secondary | ICD-10-CM | POA: Diagnosis not present

## 2023-06-17 DIAGNOSIS — E041 Nontoxic single thyroid nodule: Secondary | ICD-10-CM | POA: Diagnosis not present

## 2023-06-28 ENCOUNTER — Ambulatory Visit: Payer: BC Managed Care – PPO

## 2023-06-28 ENCOUNTER — Ambulatory Visit
Admission: RE | Admit: 2023-06-28 | Discharge: 2023-06-28 | Disposition: A | Discharge status: Home / Self Care | Payer: 59 | Source: Ambulatory Visit | Attending: Obstetrics and Gynecology | Admitting: Obstetrics and Gynecology

## 2023-06-28 DIAGNOSIS — R928 Other abnormal and inconclusive findings on diagnostic imaging of breast: Secondary | ICD-10-CM

## 2023-07-06 ENCOUNTER — Other Ambulatory Visit: Payer: BC Managed Care – PPO

## 2023-08-05 DIAGNOSIS — L578 Other skin changes due to chronic exposure to nonionizing radiation: Secondary | ICD-10-CM | POA: Diagnosis not present

## 2023-08-05 DIAGNOSIS — D225 Melanocytic nevi of trunk: Secondary | ICD-10-CM | POA: Diagnosis not present

## 2023-08-05 DIAGNOSIS — L820 Inflamed seborrheic keratosis: Secondary | ICD-10-CM | POA: Diagnosis not present

## 2023-08-05 DIAGNOSIS — Z85828 Personal history of other malignant neoplasm of skin: Secondary | ICD-10-CM | POA: Diagnosis not present

## 2023-08-05 DIAGNOSIS — D229 Melanocytic nevi, unspecified: Secondary | ICD-10-CM | POA: Diagnosis not present

## 2023-08-05 DIAGNOSIS — L570 Actinic keratosis: Secondary | ICD-10-CM | POA: Diagnosis not present

## 2023-08-05 DIAGNOSIS — L814 Other melanin hyperpigmentation: Secondary | ICD-10-CM | POA: Diagnosis not present

## 2023-09-10 DIAGNOSIS — Z809 Family history of malignant neoplasm, unspecified: Secondary | ICD-10-CM | POA: Diagnosis not present

## 2023-09-10 DIAGNOSIS — N181 Chronic kidney disease, stage 1: Secondary | ICD-10-CM | POA: Diagnosis not present

## 2023-09-10 DIAGNOSIS — E669 Obesity, unspecified: Secondary | ICD-10-CM | POA: Diagnosis not present

## 2023-09-10 DIAGNOSIS — Z87891 Personal history of nicotine dependence: Secondary | ICD-10-CM | POA: Diagnosis not present

## 2023-09-10 DIAGNOSIS — Z833 Family history of diabetes mellitus: Secondary | ICD-10-CM | POA: Diagnosis not present

## 2023-09-10 DIAGNOSIS — I129 Hypertensive chronic kidney disease with stage 1 through stage 4 chronic kidney disease, or unspecified chronic kidney disease: Secondary | ICD-10-CM | POA: Diagnosis not present

## 2023-09-10 DIAGNOSIS — Z7984 Long term (current) use of oral hypoglycemic drugs: Secondary | ICD-10-CM | POA: Diagnosis not present

## 2023-09-10 DIAGNOSIS — E785 Hyperlipidemia, unspecified: Secondary | ICD-10-CM | POA: Diagnosis not present

## 2023-09-10 DIAGNOSIS — E1122 Type 2 diabetes mellitus with diabetic chronic kidney disease: Secondary | ICD-10-CM | POA: Diagnosis not present

## 2023-09-10 DIAGNOSIS — Z6832 Body mass index (BMI) 32.0-32.9, adult: Secondary | ICD-10-CM | POA: Diagnosis not present

## 2023-09-10 DIAGNOSIS — Z8249 Family history of ischemic heart disease and other diseases of the circulatory system: Secondary | ICD-10-CM | POA: Diagnosis not present

## 2023-09-27 DIAGNOSIS — H17813 Minor opacity of cornea, bilateral: Secondary | ICD-10-CM | POA: Diagnosis not present

## 2023-09-27 DIAGNOSIS — H524 Presbyopia: Secondary | ICD-10-CM | POA: Diagnosis not present

## 2023-09-27 DIAGNOSIS — H25011 Cortical age-related cataract, right eye: Secondary | ICD-10-CM | POA: Diagnosis not present

## 2023-09-27 DIAGNOSIS — H5203 Hypermetropia, bilateral: Secondary | ICD-10-CM | POA: Diagnosis not present

## 2023-12-16 DIAGNOSIS — R7301 Impaired fasting glucose: Secondary | ICD-10-CM | POA: Diagnosis not present

## 2023-12-16 DIAGNOSIS — E7800 Pure hypercholesterolemia, unspecified: Secondary | ICD-10-CM | POA: Diagnosis not present

## 2024-01-20 DIAGNOSIS — E041 Nontoxic single thyroid nodule: Secondary | ICD-10-CM | POA: Diagnosis not present

## 2024-01-20 DIAGNOSIS — I10 Essential (primary) hypertension: Secondary | ICD-10-CM | POA: Diagnosis not present

## 2024-01-20 DIAGNOSIS — E1165 Type 2 diabetes mellitus with hyperglycemia: Secondary | ICD-10-CM | POA: Diagnosis not present

## 2024-01-20 DIAGNOSIS — R7301 Impaired fasting glucose: Secondary | ICD-10-CM | POA: Diagnosis not present

## 2024-01-20 DIAGNOSIS — E7800 Pure hypercholesterolemia, unspecified: Secondary | ICD-10-CM | POA: Diagnosis not present

## 2024-03-24 DIAGNOSIS — M1712 Unilateral primary osteoarthritis, left knee: Secondary | ICD-10-CM | POA: Diagnosis not present

## 2024-03-24 DIAGNOSIS — M25562 Pain in left knee: Secondary | ICD-10-CM | POA: Diagnosis not present

## 2024-03-24 DIAGNOSIS — G8929 Other chronic pain: Secondary | ICD-10-CM | POA: Diagnosis not present

## 2024-03-29 ENCOUNTER — Other Ambulatory Visit: Payer: Self-pay | Admitting: Obstetrics and Gynecology

## 2024-03-29 DIAGNOSIS — Z1231 Encounter for screening mammogram for malignant neoplasm of breast: Secondary | ICD-10-CM

## 2024-06-05 DIAGNOSIS — R7301 Impaired fasting glucose: Secondary | ICD-10-CM | POA: Diagnosis not present

## 2024-06-05 DIAGNOSIS — E041 Nontoxic single thyroid nodule: Secondary | ICD-10-CM | POA: Diagnosis not present

## 2024-06-05 DIAGNOSIS — E7800 Pure hypercholesterolemia, unspecified: Secondary | ICD-10-CM | POA: Diagnosis not present

## 2024-06-08 ENCOUNTER — Ambulatory Visit

## 2024-06-09 DIAGNOSIS — E7800 Pure hypercholesterolemia, unspecified: Secondary | ICD-10-CM | POA: Diagnosis not present

## 2024-06-09 DIAGNOSIS — E1165 Type 2 diabetes mellitus with hyperglycemia: Secondary | ICD-10-CM | POA: Diagnosis not present

## 2024-06-09 DIAGNOSIS — E041 Nontoxic single thyroid nodule: Secondary | ICD-10-CM | POA: Diagnosis not present

## 2024-06-09 DIAGNOSIS — I10 Essential (primary) hypertension: Secondary | ICD-10-CM | POA: Diagnosis not present
